# Patient Record
Sex: Male | Born: 1980 | Race: Black or African American | Hispanic: No | Marital: Single | Smoking: Never smoker
Health system: Southern US, Community
[De-identification: ages and names within clinical notes are randomized; demographics above are authoritative.]

## PROBLEM LIST (undated history)

## (undated) DIAGNOSIS — S86019A Strain of unspecified Achilles tendon, initial encounter: Secondary | ICD-10-CM

## (undated) HISTORY — PX: NO PAST SURGERIES: SHX2092

---

## 2015-03-15 ENCOUNTER — Emergency Department (HOSPITAL_COMMUNITY): Payer: Self-pay

## 2015-03-15 ENCOUNTER — Encounter (HOSPITAL_COMMUNITY): Payer: Self-pay | Admitting: Emergency Medicine

## 2015-03-15 ENCOUNTER — Emergency Department (HOSPITAL_COMMUNITY)
Admission: EM | Admit: 2015-03-15 | Discharge: 2015-03-15 | Disposition: A | Discharge status: Home / Self Care | Payer: Self-pay | Attending: Emergency Medicine | Admitting: Emergency Medicine

## 2015-03-15 DIAGNOSIS — Y9367 Activity, basketball: Secondary | ICD-10-CM | POA: Insufficient documentation

## 2015-03-15 DIAGNOSIS — S86002A Unspecified injury of left Achilles tendon, initial encounter: Secondary | ICD-10-CM | POA: Insufficient documentation

## 2015-03-15 DIAGNOSIS — Y92310 Basketball court as the place of occurrence of the external cause: Secondary | ICD-10-CM | POA: Insufficient documentation

## 2015-03-15 DIAGNOSIS — X58XXXA Exposure to other specified factors, initial encounter: Secondary | ICD-10-CM | POA: Insufficient documentation

## 2015-03-15 DIAGNOSIS — Y998 Other external cause status: Secondary | ICD-10-CM | POA: Insufficient documentation

## 2015-03-15 MED ORDER — HYDROCODONE-ACETAMINOPHEN 5-325 MG PO TABS: 1.0000 | ORAL_TABLET | ORAL | Status: DC | PRN

## 2015-03-15 MED ORDER — NAPROXEN 500 MG PO TABS
500.0000 mg | ORAL_TABLET | Freq: Once | ORAL | Status: AC
  Administered 2015-03-15: 500 mg via ORAL
  Filled 2015-03-15: qty 1

## 2015-03-15 MED ORDER — HYDROCODONE-ACETAMINOPHEN 5-325 MG PO TABS
1.0000 | ORAL_TABLET | Freq: Once | ORAL | Status: AC
  Administered 2015-03-15: 1 via ORAL
  Filled 2015-03-15: qty 1

## 2015-03-15 MED ORDER — NAPROXEN 500 MG PO TABS: 500.0000 mg | ORAL_TABLET | Freq: Two times a day (BID) | ORAL | Status: DC

## 2015-03-15 NOTE — Discharge Instructions (Signed)
Please follow the directions provided. Be sure to follow up with orthopedic doctor for further management of this injury. Be sure to wear your Cam Walker for comfort and support. You may take Naproxen twice a day for pain.  You may take vicodin for pain not relieved by naproxen.  Don't hesitate to return for any new, worsening or concerning symptoms.     SEEK IMMEDIATE MEDICAL CARE IF:  Pain increases, despite treatment.  Cast discomfort develops.  New, unexplained symptoms develop (drugs used in treatment may produce side effects).

## 2015-03-15 NOTE — ED Provider Notes (Signed)
CSN: [id number]     Arrival date & time 03/15/15  2036 History  This chart was scribed for non-physician provider Harle BattiestElizabeth Bailyn Spackman, NP-C, working with Rolan BuccoMelanie Belfi, MD by Phillis HaggisGabriella Gaje, ED Scribe. This patient was seen in room WTR9/WTR9 and patient care was started at 9:06 PM.    Chief Complaint  Patient presents with  . Ankle Injury   Patient is a 34 y.o. male presenting with lower extremity injury. The history is provided by the patient. No language interpreter was used.  Ankle Injury  HPI Comments: Zachary Memoslex Ramberg is a 34 y.o. male who presents to the Emergency Department complaining of left ankle injury onset PTA. He states that he was playing basketball when he tried to cross over and it felt like someone stepped on the back of his foot on the Achilles tendon. He states that he had to stop playing due to pain. He currently rates his pain as 5/10. He states that he is able to bear weight on the ankle but there is discomfort. He reports pain with flexion of foot. He denies allergies to medications.  History reviewed. No pertinent past medical history. History reviewed. No pertinent past surgical history. History reviewed. No pertinent family history. History  Substance Use Topics  . Smoking status: Not on file  . Smokeless tobacco: Not on file  . Alcohol Use: Not on file    Review of Systems  Musculoskeletal: Positive for arthralgias. Negative for joint swelling.  Skin: Negative for color change and wound.  Neurological: Negative for weakness and numbness.   Allergies  Review of patient's allergies indicates no known allergies.  Home Medications   Prior to Admission medications   Not on File   BP 159/97 mmHg  Pulse 121  Temp(Src) 98.7 F (37.1 C) (Oral)  Resp 18  SpO2 95%  Physical Exam  Constitutional: He is oriented to person, place, and time. He appears well-developed and well-nourished. No distress.  HENT:  Head: Normocephalic and atraumatic.  Eyes: Conjunctivae  and EOM are normal.  Neck: Normal range of motion. Neck supple.  Cardiovascular: Normal rate, regular rhythm and normal heart sounds.   Pulmonary/Chest: Effort normal and breath sounds normal.  Musculoskeletal: Normal range of motion. He exhibits no edema.  Decreased tension noted to the left achilles  Neurological: He is alert and oriented to person, place, and time.  Neurovascularly intact, 3/5 strength with plantarflexion of foot,but 5/5 of dorsiflexion.   Skin: Skin is warm and dry.  Psychiatric: He has a normal mood and affect. His behavior is normal.  Nursing note and vitals reviewed.   ED Course  Procedures (including critical care time) DIAGNOSTIC STUDIES: Oxygen Saturation is 95% on room air, normal by my interpretation.    COORDINATION OF CARE: 9:09 PM-Discussed treatment plan which includes x-ray, pain medication, and follow up with orthopedics if necessary with pt at bedside and pt agreed to plan.   Labs Review Labs Reviewed - No data to display  Imaging Review Dg Ankle Complete Left  03/15/2015   CLINICAL DATA:  Recent twisting injury while playing basketball, initial encounter  EXAM: LEFT ANKLE COMPLETE - 3+ VIEW  COMPARISON:  None.  FINDINGS: No acute fracture or dislocation is noted. No gross soft tissue abnormality is seen.  IMPRESSION: No acute abnormality noted.   Electronically Signed   By: Alcide CleverMark  Lukens M.D.   On: 03/15/2015 22:36     EKG Interpretation None      MDM   Final diagnoses:  Achilles  tendon injury, left, initial encounter   34 yo with ankle pain and exam concerning for achilles tendon injury.  X-ray is negative for fracture. Consulted Dr. Magnus IvanBlackman (orthopedics), recommended Cam walker and follow-up in office. Discussed plan with pt. Pt is well-appearing, in no acute distress and vital signs reviewed and not concerning. He appears safe to be discharged.  Discharge include follow-up with orthopedics.  Return precautions provided. Pt aware of plan  and in agreement.    I personally performed the services described in this documentation, which was scribed in my presence. The recorded information has been reviewed and is accurate.  Filed Vitals:   03/15/15 2047 03/15/15 2128 03/15/15 2309  BP: 159/97  146/86  Pulse: 121 109 96  Temp: 98.7 F (37.1 C)    TempSrc: Oral    Resp: 18    SpO2: 95%  97%   Meds given in ED:  Medications  HYDROcodone-acetaminophen (NORCO/VICODIN) 5-325 MG per tablet 1 tablet (1 tablet Oral Given 03/15/15 2126)  naproxen (NAPROSYN) tablet 500 mg (500 mg Oral Given 03/15/15 2126)    Discharge Medication List as of 03/15/2015 10:49 PM    START taking these medications   Details  HYDROcodone-acetaminophen (NORCO/VICODIN) 5-325 MG per tablet Take 1 tablet by mouth every 4 (four) hours as needed., Starting 03/15/2015, Until Discontinued, Print    naproxen (NAPROSYN) 500 MG tablet Take 1 tablet (500 mg total) by mouth 2 (two) times daily., Starting 03/15/2015, Until Discontinued, Print          Harle BattiestElizabeth Million Maharaj, NP 03/16/15 [id number]  Rolan BuccoMelanie Belfi, MD 03/17/15 [id number]

## 2015-03-15 NOTE — ED Notes (Signed)
Pt states he was playing basketball prior to ED arrival. While playing he states "it felt like someone stepped on the back of my ankle, and since then it's hurt to point my foot." No obvious swelling observed, palpable pulses bilaterally, sensation intact.

## 2015-03-17 ENCOUNTER — Other Ambulatory Visit (HOSPITAL_COMMUNITY): Payer: Self-pay | Admitting: Orthopaedic Surgery

## 2015-03-20 ENCOUNTER — Other Ambulatory Visit (HOSPITAL_COMMUNITY): Payer: Self-pay | Admitting: Orthopaedic Surgery

## 2015-03-20 ENCOUNTER — Other Ambulatory Visit (HOSPITAL_COMMUNITY): Payer: Self-pay | Admitting: *Deleted

## 2015-03-20 ENCOUNTER — Encounter (HOSPITAL_COMMUNITY): Payer: Self-pay | Admitting: *Deleted

## 2015-03-22 MED ORDER — DEXTROSE 5 % IV SOLN
3.0000 g | INTRAVENOUS | Status: AC
  Administered 2015-03-23: 3 g via INTRAVENOUS
  Filled 2015-03-22: qty 3000

## 2015-03-23 ENCOUNTER — Encounter (HOSPITAL_COMMUNITY): Payer: Self-pay | Admitting: *Deleted

## 2015-03-23 ENCOUNTER — Ambulatory Visit (HOSPITAL_COMMUNITY): Payer: Self-pay | Admitting: Anesthesiology

## 2015-03-23 ENCOUNTER — Ambulatory Visit (HOSPITAL_COMMUNITY)
Admission: RE | Admit: 2015-03-23 | Discharge: 2015-03-23 | Disposition: A | Discharge status: Home / Self Care | Payer: Self-pay | Source: Ambulatory Visit | Attending: Orthopaedic Surgery | Admitting: Orthopaedic Surgery

## 2015-03-23 ENCOUNTER — Encounter (HOSPITAL_COMMUNITY)
Admission: RE | Disposition: A | Discharge status: Home / Self Care | Payer: Self-pay | Source: Ambulatory Visit | Attending: Orthopaedic Surgery

## 2015-03-23 DIAGNOSIS — Y9367 Activity, basketball: Secondary | ICD-10-CM | POA: Insufficient documentation

## 2015-03-23 DIAGNOSIS — Z79899 Other long term (current) drug therapy: Secondary | ICD-10-CM | POA: Insufficient documentation

## 2015-03-23 DIAGNOSIS — Z79891 Long term (current) use of opiate analgesic: Secondary | ICD-10-CM | POA: Insufficient documentation

## 2015-03-23 DIAGNOSIS — X58XXXA Exposure to other specified factors, initial encounter: Secondary | ICD-10-CM | POA: Insufficient documentation

## 2015-03-23 DIAGNOSIS — Z791 Long term (current) use of non-steroidal anti-inflammatories (NSAID): Secondary | ICD-10-CM | POA: Insufficient documentation

## 2015-03-23 DIAGNOSIS — Z6837 Body mass index (BMI) 37.0-37.9, adult: Secondary | ICD-10-CM | POA: Insufficient documentation

## 2015-03-23 DIAGNOSIS — S86012A Strain of left Achilles tendon, initial encounter: Secondary | ICD-10-CM

## 2015-03-23 DIAGNOSIS — Y999 Unspecified external cause status: Secondary | ICD-10-CM | POA: Insufficient documentation

## 2015-03-23 DIAGNOSIS — Y929 Unspecified place or not applicable: Secondary | ICD-10-CM | POA: Insufficient documentation

## 2015-03-23 HISTORY — PX: ACHILLES TENDON SURGERY: SHX542

## 2015-03-23 HISTORY — DX: Strain of unspecified achilles tendon, initial encounter: S86.019A

## 2015-03-23 LAB — CBC
HEMATOCRIT: 44.5 % (ref 39.0–52.0)
HEMOGLOBIN: 14.5 g/dL (ref 13.0–17.0)
MCH: 28.7 pg (ref 26.0–34.0)
MCHC: 32.6 g/dL (ref 30.0–36.0)
MCV: 88.1 fL (ref 78.0–100.0)
Platelets: 221 10*3/uL (ref 150–400)
RBC: 5.05 MIL/uL (ref 4.22–5.81)
RDW: 13.2 % (ref 11.5–15.5)
WBC: 8 10*3/uL (ref 4.0–10.5)

## 2015-03-23 SURGERY — REPAIR, TENDON, ACHILLES
Anesthesia: General | Site: Leg Lower | Laterality: Left

## 2015-03-23 MED ORDER — ASPIRIN EC 325 MG PO TBEC: 325.0000 mg | DELAYED_RELEASE_TABLET | Freq: Two times a day (BID) | ORAL | Status: AC

## 2015-03-23 MED ORDER — HYDROMORPHONE HCL 1 MG/ML IJ SOLN: 0.2500 mg | INTRAMUSCULAR | Status: DC | PRN

## 2015-03-23 MED ORDER — 0.9 % SODIUM CHLORIDE (POUR BTL) OPTIME
TOPICAL | Status: DC | PRN
  Administered 2015-03-23: 1000 mL

## 2015-03-23 MED ORDER — PROMETHAZINE HCL 25 MG/ML IJ SOLN: 6.2500 mg | INTRAMUSCULAR | Status: DC | PRN

## 2015-03-23 MED ORDER — GLYCOPYRROLATE 0.2 MG/ML IJ SOLN
INTRAMUSCULAR | Status: DC | PRN
  Administered 2015-03-23: 0.6 mg via INTRAVENOUS

## 2015-03-23 MED ORDER — PROPOFOL 10 MG/ML IV BOLUS
INTRAVENOUS | Status: DC | PRN
  Administered 2015-03-23: 250 mg via INTRAVENOUS

## 2015-03-23 MED ORDER — ONDANSETRON HCL 4 MG/2ML IJ SOLN
INTRAMUSCULAR | Status: DC | PRN
  Administered 2015-03-23: 4 mg via INTRAVENOUS

## 2015-03-23 MED ORDER — ONDANSETRON HCL 4 MG/2ML IJ SOLN
INTRAMUSCULAR | Status: AC
  Filled 2015-03-23: qty 2

## 2015-03-23 MED ORDER — DEXAMETHASONE SODIUM PHOSPHATE 10 MG/ML IJ SOLN
INTRAMUSCULAR | Status: AC
  Filled 2015-03-23: qty 1

## 2015-03-23 MED ORDER — GLYCOPYRROLATE 0.2 MG/ML IJ SOLN
INTRAMUSCULAR | Status: AC
  Filled 2015-03-23: qty 3

## 2015-03-23 MED ORDER — ROCURONIUM BROMIDE 100 MG/10ML IV SOLN
INTRAVENOUS | Status: DC | PRN
  Administered 2015-03-23: 30 mg via INTRAVENOUS

## 2015-03-23 MED ORDER — DEXAMETHASONE SODIUM PHOSPHATE 10 MG/ML IJ SOLN
INTRAMUSCULAR | Status: DC | PRN
  Administered 2015-03-23: 10 mg via INTRAVENOUS

## 2015-03-23 MED ORDER — NEOSTIGMINE METHYLSULFATE 10 MG/10ML IV SOLN
INTRAVENOUS | Status: AC
Start: 2015-03-23 — End: 2015-03-23
  Filled 2015-03-23: qty 1

## 2015-03-23 MED ORDER — NEOSTIGMINE METHYLSULFATE 10 MG/10ML IV SOLN
INTRAVENOUS | Status: DC | PRN
  Administered 2015-03-23: 4 mg via INTRAVENOUS

## 2015-03-23 MED ORDER — MIDAZOLAM HCL 2 MG/2ML IJ SOLN
INTRAMUSCULAR | Status: AC
  Filled 2015-03-23: qty 2

## 2015-03-23 MED ORDER — FENTANYL CITRATE (PF) 100 MCG/2ML IJ SOLN
INTRAMUSCULAR | Status: DC | PRN
  Administered 2015-03-23: 100 ug via INTRAVENOUS
  Administered 2015-03-23: 50 ug via INTRAVENOUS
  Administered 2015-03-23: 100 ug via INTRAVENOUS

## 2015-03-23 MED ORDER — OXYCODONE-ACETAMINOPHEN 5-325 MG PO TABS: 1.0000 | ORAL_TABLET | ORAL | Status: AC | PRN

## 2015-03-23 MED ORDER — EPHEDRINE SULFATE 50 MG/ML IJ SOLN
INTRAMUSCULAR | Status: AC
  Filled 2015-03-23: qty 1

## 2015-03-23 MED ORDER — HYDROMORPHONE HCL 2 MG/ML IJ SOLN
INTRAMUSCULAR | Status: AC
  Filled 2015-03-23: qty 1

## 2015-03-23 MED ORDER — PROPOFOL 10 MG/ML IV BOLUS
INTRAVENOUS | Status: AC
  Filled 2015-03-23: qty 20

## 2015-03-23 MED ORDER — LIDOCAINE HCL (CARDIAC) 20 MG/ML IV SOLN
INTRAVENOUS | Status: AC
  Filled 2015-03-23: qty 5

## 2015-03-23 MED ORDER — MIDAZOLAM HCL 5 MG/5ML IJ SOLN
INTRAMUSCULAR | Status: DC | PRN
  Administered 2015-03-23: 2 mg via INTRAVENOUS

## 2015-03-23 MED ORDER — LIDOCAINE HCL (CARDIAC) 20 MG/ML IV SOLN
INTRAVENOUS | Status: DC | PRN
  Administered 2015-03-23: 100 mg via INTRAVENOUS

## 2015-03-23 MED ORDER — BUPIVACAINE HCL 0.25 % IJ SOLN
INTRAMUSCULAR | Status: DC | PRN
  Administered 2015-03-23: 20 mL

## 2015-03-23 MED ORDER — BUPIVACAINE HCL (PF) 0.25 % IJ SOLN
INTRAMUSCULAR | Status: AC
  Filled 2015-03-23: qty 30

## 2015-03-23 MED ORDER — FENTANYL CITRATE (PF) 250 MCG/5ML IJ SOLN
INTRAMUSCULAR | Status: AC
  Filled 2015-03-23: qty 5

## 2015-03-23 MED ORDER — LACTATED RINGERS IV SOLN
INTRAVENOUS | Status: DC
  Administered 2015-03-23: 10:00:00 via INTRAVENOUS

## 2015-03-23 MED ORDER — HYDROMORPHONE HCL 1 MG/ML IJ SOLN
INTRAMUSCULAR | Status: DC | PRN
  Administered 2015-03-23: 1 mg via INTRAVENOUS

## 2015-03-23 MED ORDER — ROCURONIUM BROMIDE 100 MG/10ML IV SOLN
INTRAVENOUS | Status: AC
  Filled 2015-03-23: qty 1

## 2015-03-23 MED ORDER — SODIUM CHLORIDE 0.9 % IJ SOLN
INTRAMUSCULAR | Status: AC
  Filled 2015-03-23: qty 10

## 2015-03-23 MED ORDER — OXYCODONE-ACETAMINOPHEN 5-325 MG PO TABS: 1.0000 | ORAL_TABLET | ORAL | Status: DC | PRN

## 2015-03-23 MED ORDER — SUCCINYLCHOLINE CHLORIDE 20 MG/ML IJ SOLN
INTRAMUSCULAR | Status: DC | PRN
  Administered 2015-03-23: 120 mg via INTRAVENOUS

## 2015-03-23 SURGICAL SUPPLY — 44 items
BANDAGE ELASTIC 4 VELCRO ST LF (GAUZE/BANDAGES/DRESSINGS) ×3 IMPLANT
BANDAGE ELASTIC 6 VELCRO ST LF (GAUZE/BANDAGES/DRESSINGS) ×3 IMPLANT
BANDAGE ESMARK 6X9 LF (GAUZE/BANDAGES/DRESSINGS) ×1 IMPLANT
BNDG ESMARK 6X9 LF (GAUZE/BANDAGES/DRESSINGS) ×3
CUFF TOURN SGL QUICK 34 (TOURNIQUET CUFF) ×2
CUFF TRNQT CYL 34X4X40X1 (TOURNIQUET CUFF) ×1 IMPLANT
DRAPE POUCH INSTRU U-SHP 10X18 (DRAPES) ×3 IMPLANT
DRAPE U-SHAPE 47X51 STRL (DRAPES) ×3 IMPLANT
DRSG PAD ABDOMINAL 8X10 ST (GAUZE/BANDAGES/DRESSINGS) ×3 IMPLANT
DURAPREP 26ML APPLICATOR (WOUND CARE) ×3 IMPLANT
ELECT REM PT RETURN 9FT ADLT (ELECTROSURGICAL) ×3
ELECTRODE REM PT RTRN 9FT ADLT (ELECTROSURGICAL) ×1 IMPLANT
GAUZE SPONGE 4X4 12PLY STRL (GAUZE/BANDAGES/DRESSINGS) ×3 IMPLANT
GAUZE XEROFORM 1X8 LF (GAUZE/BANDAGES/DRESSINGS) ×3 IMPLANT
GLOVE BIO SURGEON STRL SZ7.5 (GLOVE) ×6 IMPLANT
GLOVE BIOGEL PI IND STRL 7.5 (GLOVE) ×1 IMPLANT
GLOVE BIOGEL PI IND STRL 8 (GLOVE) ×2 IMPLANT
GLOVE BIOGEL PI INDICATOR 7.5 (GLOVE) ×2
GLOVE BIOGEL PI INDICATOR 8 (GLOVE) ×4
GLOVE ECLIPSE 8.0 STRL XLNG CF (GLOVE) ×3 IMPLANT
GOWN STRL REUS W/TWL XL LVL3 (GOWN DISPOSABLE) ×9 IMPLANT
KIT BASIN OR (CUSTOM PROCEDURE TRAY) ×3 IMPLANT
MANIFOLD NEPTUNE II (INSTRUMENTS) ×3 IMPLANT
NEEDLE HYPO 22GX1.5 SAFETY (NEEDLE) ×3 IMPLANT
NEEDLE MAYO .5 CIRCLE (NEEDLE) IMPLANT
PACK ORTHO EXTREMITY (CUSTOM PROCEDURE TRAY) ×3 IMPLANT
PAD CAST 4YDX4 CTTN HI CHSV (CAST SUPPLIES) ×1 IMPLANT
PADDING CAST COTTON 4X4 STRL (CAST SUPPLIES) ×2
PADDING CAST COTTON 6X4 STRL (CAST SUPPLIES) ×3 IMPLANT
POSITIONER SURGICAL ARM (MISCELLANEOUS) ×3 IMPLANT
SPLINT PLASTER CAST XFAST 5X30 (CAST SUPPLIES) ×1 IMPLANT
SPLINT PLASTER XFAST SET 5X30 (CAST SUPPLIES) ×2
SPONGE LAP 4X18 X RAY DECT (DISPOSABLE) ×6 IMPLANT
STOCKINETTE 6  STRL (DRAPES) ×2
STOCKINETTE 6 STRL (DRAPES) ×1 IMPLANT
SUT ETHILON 3 0 PS 1 (SUTURE) ×6 IMPLANT
SUT FIBERWIRE #2 38 T-5 BLUE (SUTURE) ×3
SUT VIC AB 0 CT1 36 (SUTURE) ×6 IMPLANT
SUT VIC AB 1 CT1 36 (SUTURE) ×3 IMPLANT
SUT VIC AB 2-0 CT1 27 (SUTURE) ×2
SUT VIC AB 2-0 CT1 TAPERPNT 27 (SUTURE) ×1 IMPLANT
SUTURE FIBERWR #2 38 T-5 BLUE (SUTURE) ×1 IMPLANT
SYR 20CC LL (SYRINGE) ×3 IMPLANT
TOWEL OR 17X26 10 PK STRL BLUE (TOWEL DISPOSABLE) ×3 IMPLANT

## 2015-03-23 NOTE — Progress Notes (Signed)
Ortho technician is here to fit patient to crutches. Patient is given crutch instruction by technician with teach back.

## 2015-03-23 NOTE — Anesthesia Postprocedure Evaluation (Signed)
Anesthesia Post Note  Patient: Zachary Silva  Procedure(s) Performed: Procedure(s) (LRB): LEFT ACHILLES TENDON REPAIR (Left)  Anesthesia type: general  Patient location: PACU  Post pain: Pain level controlled  Post assessment: Patient's Cardiovascular Status Stable  Last Vitals:  Filed Vitals:   03/23/15 1242  BP: 130/62  Pulse: 93  Temp: 36.3 C  Resp: 12    Post vital signs: Reviewed and stable  Level of consciousness: sedated  Complications: No apparent anesthesia complications

## 2015-03-23 NOTE — Transfer of Care (Signed)
Immediate Anesthesia Transfer of Care Note  Patient: Zachary Silva  Procedure(s) Performed: Procedure(s): LEFT ACHILLES TENDON REPAIR (Left)  Patient Location: PACU  Anesthesia Type:General  Level of Consciousness: awake, alert  and oriented  Airway & Oxygen Therapy: Patient Spontanous Breathing and Patient connected to face mask oxygen  Post-op Assessment: Report given to RN and Post -op Vital signs reviewed and stable  Post vital signs: Reviewed and stable  Last Vitals:  Filed Vitals:   03/23/15 0931  BP: 141/82  Pulse: 84  Temp: 36.9 C  Resp: 18    Complications: No apparent anesthesia complications

## 2015-03-23 NOTE — H&P (Signed)
Zachary Silva is an 34 y.o. male.   Chief Complaint:   Left ankle pain and weakness; known achilles tendon rupture HPI:   34 yo male with an acute left achilles tendon rupture following an injury playing basketball.  A repair has been recommended and this risks and benefits have been explained in great detail including the risks of infection' re-rupture, and DVT.  Past Medical History  Diagnosis Date  . Achilles tendon rupture     left    Past Surgical History  Procedure Laterality Date  . No past surgeries      History reviewed. No pertinent family history. Social History:  reports that he has never smoked. He has never used smokeless tobacco. He reports that he does not drink alcohol or use illicit drugs.  Allergies: No Known Allergies  Medications Prior to Admission  Medication Sig Dispense Refill  . HYDROcodone-acetaminophen (NORCO/VICODIN) 5-325 MG per tablet Take 1 tablet by mouth every 4 (four) hours as needed. (Patient taking differently: Take 1 tablet by mouth every 4 (four) hours as needed for moderate pain. ) 10 tablet 0  . Multiple Vitamins-Minerals (AIRBORNE) CHEW Chew 1-2 tablets by mouth daily.    . naproxen (NAPROSYN) 500 MG tablet Take 1 tablet (500 mg total) by mouth 2 (two) times daily. 30 tablet 0    Results for orders placed or performed during the hospital encounter of 03/23/15 (from the past 48 hour(s))  CBC     Status: None   Collection Time: 03/23/15  9:48 AM  Result Value Ref Range   WBC 8.0 4.0 - 10.5 K/uL   RBC 5.05 4.22 - 5.81 MIL/uL   Hemoglobin 14.5 13.0 - 17.0 g/dL   HCT 16.144.5 09.639.0 - 04.552.0 %   MCV 88.1 78.0 - 100.0 fL   MCH 28.7 26.0 - 34.0 pg   MCHC 32.6 30.0 - 36.0 g/dL   RDW 40.913.2 81.111.5 - 91.415.5 %   Platelets 221 150 - 400 K/uL   No results found.  Review of Systems  All other systems reviewed and are negative.   Blood pressure 141/82, pulse 84, temperature 98.4 F (36.9 C), temperature source Oral, resp. rate 18, height 5' 11.5" (1.816 m),  weight 124.286 kg (274 lb), SpO2 100 %. Physical Exam  Constitutional: He is oriented to person, place, and time. He appears well-developed and well-nourished.  HENT:  Head: Normocephalic and atraumatic.  Eyes: EOM are normal. Pupils are equal, round, and reactive to light.  Neck: Normal range of motion. Neck supple.  Cardiovascular: Normal rate and regular rhythm.   Respiratory: Effort normal and breath sounds normal.  GI: Soft. Bowel sounds are normal.  Musculoskeletal:       Left ankle: He exhibits decreased range of motion, swelling and ecchymosis. Achilles tendon exhibits abnormal Thompson's test results.  Neurological: He is alert and oriented to person, place, and time.  Skin: Skin is warm and dry.  Psychiatric: He has a normal mood and affect.     Assessment/Plan Acute left achilles tendon complete rupture 1)  To the OR today as an outpatient for a direct primary repair of his ruptured achilles tendon  Zachary Silva Y 03/23/2015, 9:59 AM

## 2015-03-23 NOTE — Discharge Instructions (Signed)
No weight at all on your left ankle. Elevation for foot swelling. Keep your splint clean and dry. Do take a full strength 325 mg aspirin twice daily with meals. Achilles Tendon Repair, Care After Refer to this sheet in the next few weeks. These instructions provide you with information on caring for yourself after your procedure. Your health care provider may also give you more specific instructions. Your treatment has been planned according to current medical practices, but problems sometimes occur. Call your health care provider if you have any problems or questions after your procedure. WHAT TO EXPECT AFTER THE PROCEDURE  Your lower leg may feel numb for several hours.  You may feel pain when the numbing medicine wears off.  Most people can start to walk normally in about 6 weeks. It may be 6 months before you can do all your regular activities. Complete recovery can take a year or longer. HOME CARE INSTRUCTIONS  Take pain medicines as directed by your health care provider.  Do not take over-the-counter medicines unless your health care provider says it is okay.  Do not walk on or put weight on your injured leg.  Keep your cast or splint dry while bathing.  Use crutches or another walking aid.  When you are resting, keep your leg above the level of your heart.  Go back to your health care provider about 2 weeks after surgery to have your splint or cast removed. If you have stitches, your health care provider will also take them out during this time.  After your cast or splint is removed, your health care provider will give you instructions on how to move your ankle and how much weight you can put on your leg. Follow your health care provider's instructions.  See a physical therapist if directed by your health care provider. A physical therapist can help you regain ankle motion and strengthen your leg muscles.  Keep all follow-up appointments. SEEK MEDICAL CARE IF:   You have  chills.  You have a fever.  Your pain medicine is not working.  You have persistent numbness or burning. SEEK IMMEDIATE MEDICAL CARE IF:   You have pain or numbness that is getting worse.  You are unable to move your toes.  You are bleeding through your cast or splint.  You notice redness, swelling, bleeding, or discharge near your cut (incision) after your cast or splint has been removed.  You notice warmth, swelling, or tenderness in the back of your lower leg.  You have chest pain.  You have trouble breathing. MAKE SURE YOU:  Understand these instructions.  Will watch your condition.  Will get help right away if you are not doing well or get worse. Document Released: 07/03/2004 Document Revised: 11/02/2013 Document Reviewed: 09/13/2013 [facility] Patient Information 2015 [place], [place]. This information is not intended to replace advice given to you by your health care provider. Make sure you discuss any questions you have with your health care provider.  General Anesthesia, Care After Refer to this sheet in the next few weeks. These instructions provide you with information on caring for yourself after your procedure. Your health care provider may also give you more specific instructions. Your treatment has been planned according to current medical practices, but problems sometimes occur. Call your health care provider if you have any problems or questions after your procedure. WHAT TO EXPECT AFTER THE PROCEDURE After the procedure, it is typical to experience:  Sleepiness.  Nausea and vomiting. HOME CARE INSTRUCTIONS  For the first 24 hours after general anesthesia:  Have a responsible person with you.  Do not drive a car. If you are alone, do not take public transportation.  Do not drink alcohol.  Do not take medicine that has not been prescribed by your health care provider.  Do not sign important papers or make important decisions.  You may resume a normal  diet and activities as directed by your health care provider.  Change bandages (dressings) as directed.  If you have questions or problems that seem related to general anesthesia, call the hospital and ask for the anesthetist or anesthesiologist on call. SEEK MEDICAL CARE IF:  You have nausea and vomiting that continue the day after anesthesia.  You develop a rash. SEEK IMMEDIATE MEDICAL CARE IF:   You have difficulty breathing.  You have chest pain.  You have any allergic problems. Document Released: 02/03/2001 Document Revised: 11/02/2013 Document Reviewed: 05/13/2013 [facility] Patient Information 2015 [place], [place]. This information is not intended to replace advice given to you by your health care provider. Make sure you discuss any questions you have with your health care provider.

## 2015-03-23 NOTE — Anesthesia Preprocedure Evaluation (Addendum)
Anesthesia Evaluation  Patient identified by MRN, date of birth, ID band Patient awake    Reviewed: Allergy & Precautions, NPO status , Patient's Chart, lab work & pertinent test results  History of Anesthesia Complications Negative for: history of anesthetic complications  Airway Mallampati: II  TM Distance: >3 FB Neck ROM: Full    Dental  (+) Teeth Intact, Dental Advisory Given   Pulmonary neg pulmonary ROS,    Pulmonary exam normal       Cardiovascular negative cardio ROS Normal cardiovascular exam    Neuro/Psych negative neurological ROS  negative psych ROS   GI/Hepatic negative GI ROS, Neg liver ROS,   Endo/Other  Morbid obesity  Renal/GU negative Renal ROS     Musculoskeletal   Abdominal   Peds  Hematology   Anesthesia Other Findings   Reproductive/Obstetrics                           Anesthesia Physical Anesthesia Plan  ASA: II  Anesthesia Plan: General   Post-op Pain Management:    Induction: Intravenous  Airway Management Planned: LMA and Oral ETT  Additional Equipment:   Intra-op Plan:   Post-operative Plan: Extubation in OR  Informed Consent: I have reviewed the patients History and Physical, chart, labs and discussed the procedure including the risks, benefits and alternatives for the proposed anesthesia with the patient or authorized representative who has indicated his/her understanding and acceptance.   Dental advisory given  Plan Discussed with: CRNA, Anesthesiologist and Surgeon  Anesthesia Plan Comments:        Anesthesia Quick Evaluation

## 2015-03-23 NOTE — Addendum Note (Signed)
Addendum  created 03/23/15 1259 by Thornell MuleHoward G Colen Eltzroth, CRNA   Modules edited: Charges VN

## 2015-03-23 NOTE — Anesthesia Procedure Notes (Signed)
Procedure Name: Intubation Date/Time: 03/23/2015 10:28 AM Performed by: Thornell MuleSTUBBLEFIELD, Tyner Codner G Pre-anesthesia Checklist: Patient identified, Emergency Drugs available, Suction available and Patient being monitored Patient Re-evaluated:Patient Re-evaluated prior to inductionOxygen Delivery Method: Circle System Utilized Preoxygenation: Pre-oxygenation with 100% oxygen Intubation Type: IV induction Ventilation: Mask ventilation without difficulty Laryngoscope Size: Miller and 3 Grade View: Grade I Tube type: Oral Tube size: 7.5 mm Number of attempts: 1 Airway Equipment and Method: Stylet and Oral airway Placement Confirmation: ETT inserted through vocal cords under direct vision,  positive ETCO2 and breath sounds checked- equal and bilateral Secured at: 22 cm Tube secured with: Tape Dental Injury: Teeth and Oropharynx as per pre-operative assessment

## 2015-03-23 NOTE — Brief Op Note (Signed)
03/23/2015  11:25 AM  PATIENT:  Zachary Silva  34 y.o. male  PRE-OPERATIVE DIAGNOSIS:  Left achilles tendon rupture  POST-OPERATIVE DIAGNOSIS:  Left achilles tendon rupture  PROCEDURE:  Procedure(s): LEFT ACHILLES TENDON REPAIR (Left)  SURGEON:  Surgeon(s) and Role:    * Kathryne Hitchhristopher Y Blackman, MD - Primary  PHYSICIAN ASSISTANT: Rexene EdisonGil clark, PA-C  ANESTHESIA:   local and general  EBL:   minimal  BLOOD ADMINISTERED:none  DRAINS: none   LOCAL MEDICATIONS USED:  Marcaine  SPECIMEN:  No Specimen  DISPOSITION OF SPECIMEN:  N/A  COUNTS:  YES  TOURNIQUET: less than one hour  DICTATION: .Other Dictation: Dictation Number [id number]  PLAN OF CARE: Discharge to home after PACU  PATIENT DISPOSITION:  PACU - hemodynamically stable.   Delay start of Pharmacological VTE agent (>24hrs) due to surgical blood loss or risk of bleeding: not applicable

## 2015-03-24 ENCOUNTER — Encounter (HOSPITAL_COMMUNITY): Payer: Self-pay | Admitting: Orthopaedic Surgery

## 2015-03-24 NOTE — Op Note (Signed)
NAMMaren Silva:  Purtee, Jazz                 ACCOUNT NO.:  [id number]  MEDICAL RECORD NO.:  [id number]  LOCATION:  [facility]                         [facility]  PHYSICIAN:  Vanita PandaChristopher Y. Magnus IvanBlackman, M.D.DATE OF BIRTH:  Sep 06, 1981  DATE OF PROCEDURE:  03/23/2015 DATE OF DISCHARGE:  03/23/2015                              OPERATIVE REPORT   PREOPERATIVE DIAGNOSIS:  Acute complete rupture of left Achilles tendon.  POSTOPERATIVE DIAGNOSIS:  Acute complete rupture of left Achilles tendon.  PROCEDURE:  Direct primary repair of left Achilles tendon.  SURGEON:  Vanita PandaChristopher Y. Magnus IvanBlackman, MD  ASSISTANT:  Richardean CanalGilbert Clark, PA-C  ANESTHESIA: 1. General. 2. Local with 0.25% plain Marcaine.  ANTIBIOTICS:  3 g IV Ancef.  BLOOD LOSS:  Minimal.  TOURNIQUET TIME:  Less than 1 hour.  COMPLICATIONS:  None.  INDICATIONS:  Trinna Postlex is a 34 year old gentleman who a week ago was playing basketball, sustaining an acute injury to his left Achilles tendon.  We saw him in the office and diagnosed a complete rupture.  He has been in a Cam walking boot, now he presents for direct primary repair, fully understanding the risks of infection, DVT, and re-rupture rate.  He understands that he will be nonweightbearing for a period of 4 weeks and would take significant rehab once the repair has been performed and he understands this as well.  PROCEDURE DESCRIPTION:  After informed consent was obtained, appropriate left ankle was marked.  He was brought to the operating room.  General anesthesia was obtained while he was on a stretcher in the supine position.  A nonsterile tourniquet was placed around his upper left leg. He was then rolled in a prone position with appropriate rolls around his chest.  Appropriate positioning of his arms, head and neck and genitals and padding of his legs.  His left operative ankle was then prepped and draped with DuraPrep and sterile drapes.  A time-out was called to identify correct  patient, correct left ankle.  We then used an Esmarch wrap up the ankle and the tourniquet was inflated to 300 mm of pressure. We then made incision directly over the Achilles and carried this proximally and distally finding a complete rupture of the Achilles tendon with about the paratenon and cleaned up the rough edges of the Achilles.  We then used #2 FiberWire suture in a Krackow-type format with 2 limbs in distal piece of the Achilles and 2 in the proximal piece.  We then tied these down separately to bring the Achilles back together.  We oversewed this with a #1 Ethibond suture.  We then were able to bring the paratenon, back of the Achilles with 0 Vicryl followed by two 0 Vicryl in the deep tissue, 2-0 Vicryl in the subcutaneous tissue, and interrupted 2-0 nylon on the skin.  We infiltrated the incision with 0.25% plain Marcaine and placed a Xeroform and a well- padded sterile dressing, including a splint with the foot in a plantar flexed position.  He was then rolled back to a supine position, awakened and extubated and taken to recovery room in stable condition.  All final counts were correct.  There were no complications noted. Postoperatively, will be  discharged from the PACU with crutches and nonweightbearing on his left ankle with followup in the office in 2 weeks.     Vanita Pandahristopher Y. Magnus IvanBlackman, M.D.     CYB/MEDQ  D:  03/23/2015  T:  03/24/2015  Job:  [id number]

## 2016-02-03 IMAGING — CR DG ANKLE COMPLETE 3+V*L*
3 series · 3 of 3 positions shown · non-contrast
Comparison: None.

CLINICAL DATA: Recent twisting injury while playing basketball,
initial encounter

EXAM:
LEFT ANKLE COMPLETE - 3+ VIEW

[x ankle ap left]
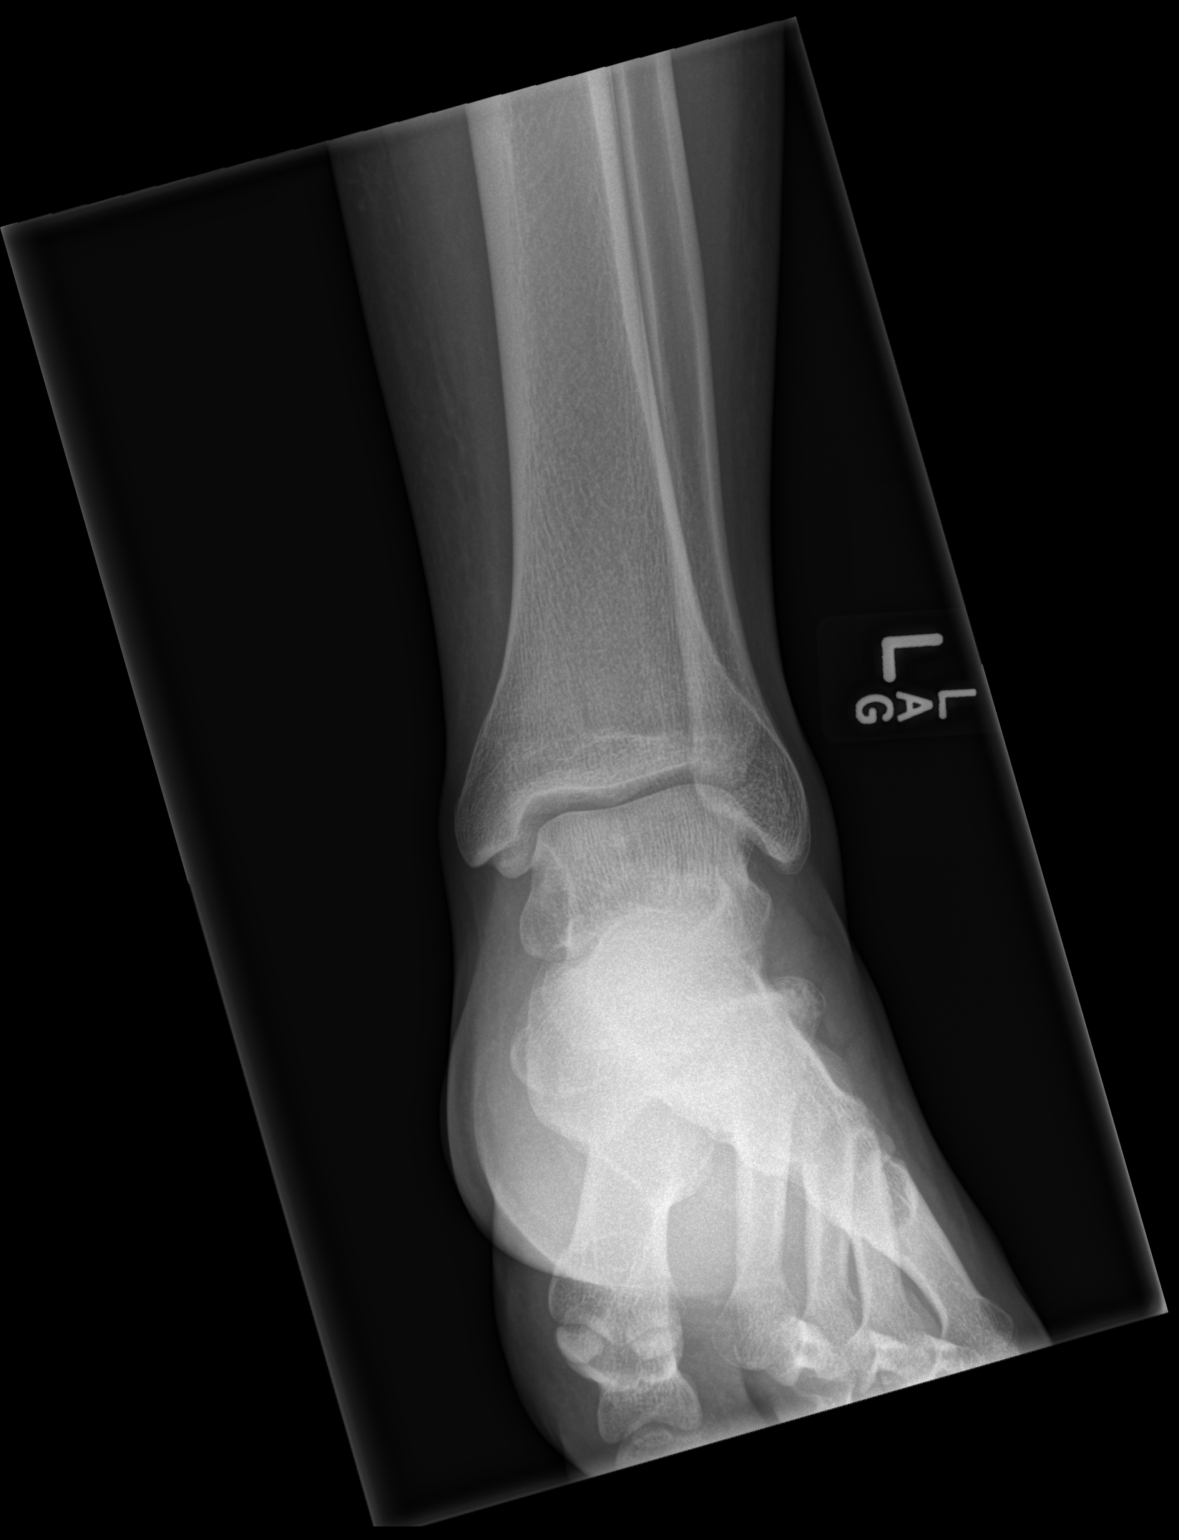

[x ankle obl left]
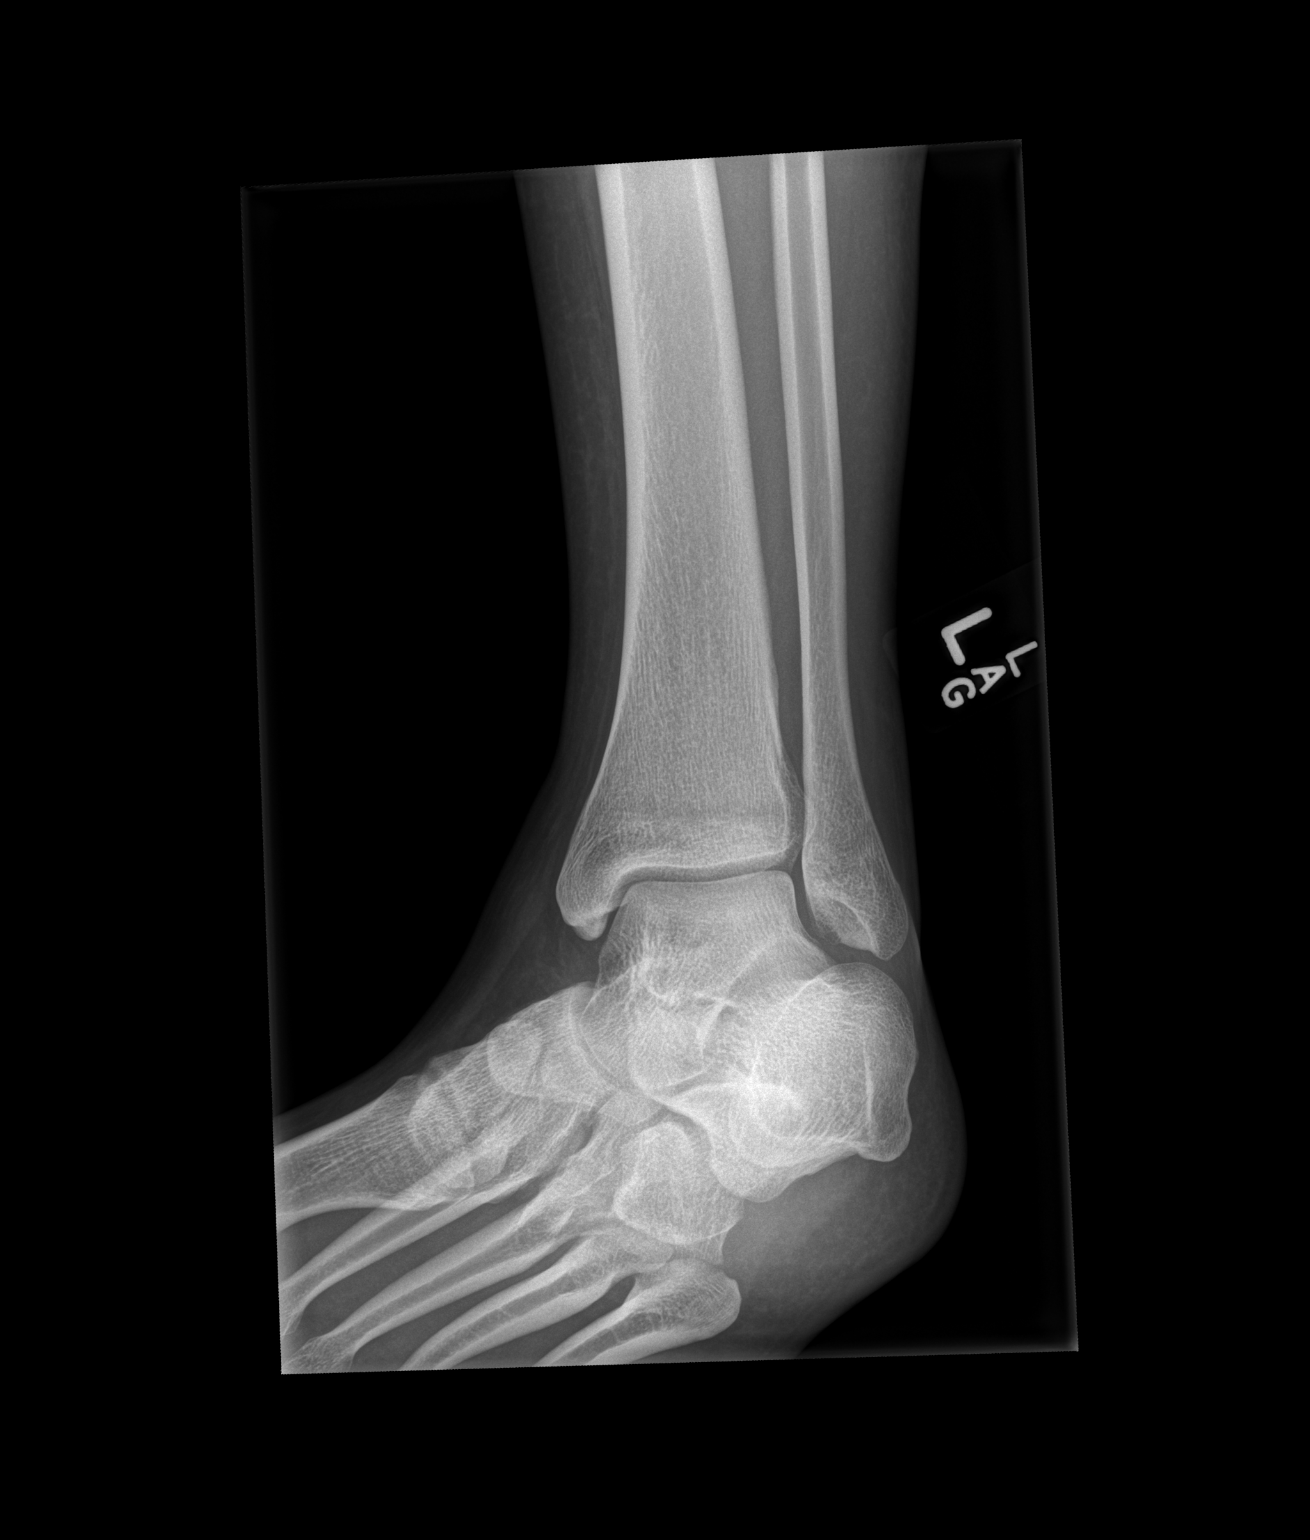

[x ankle lat left]
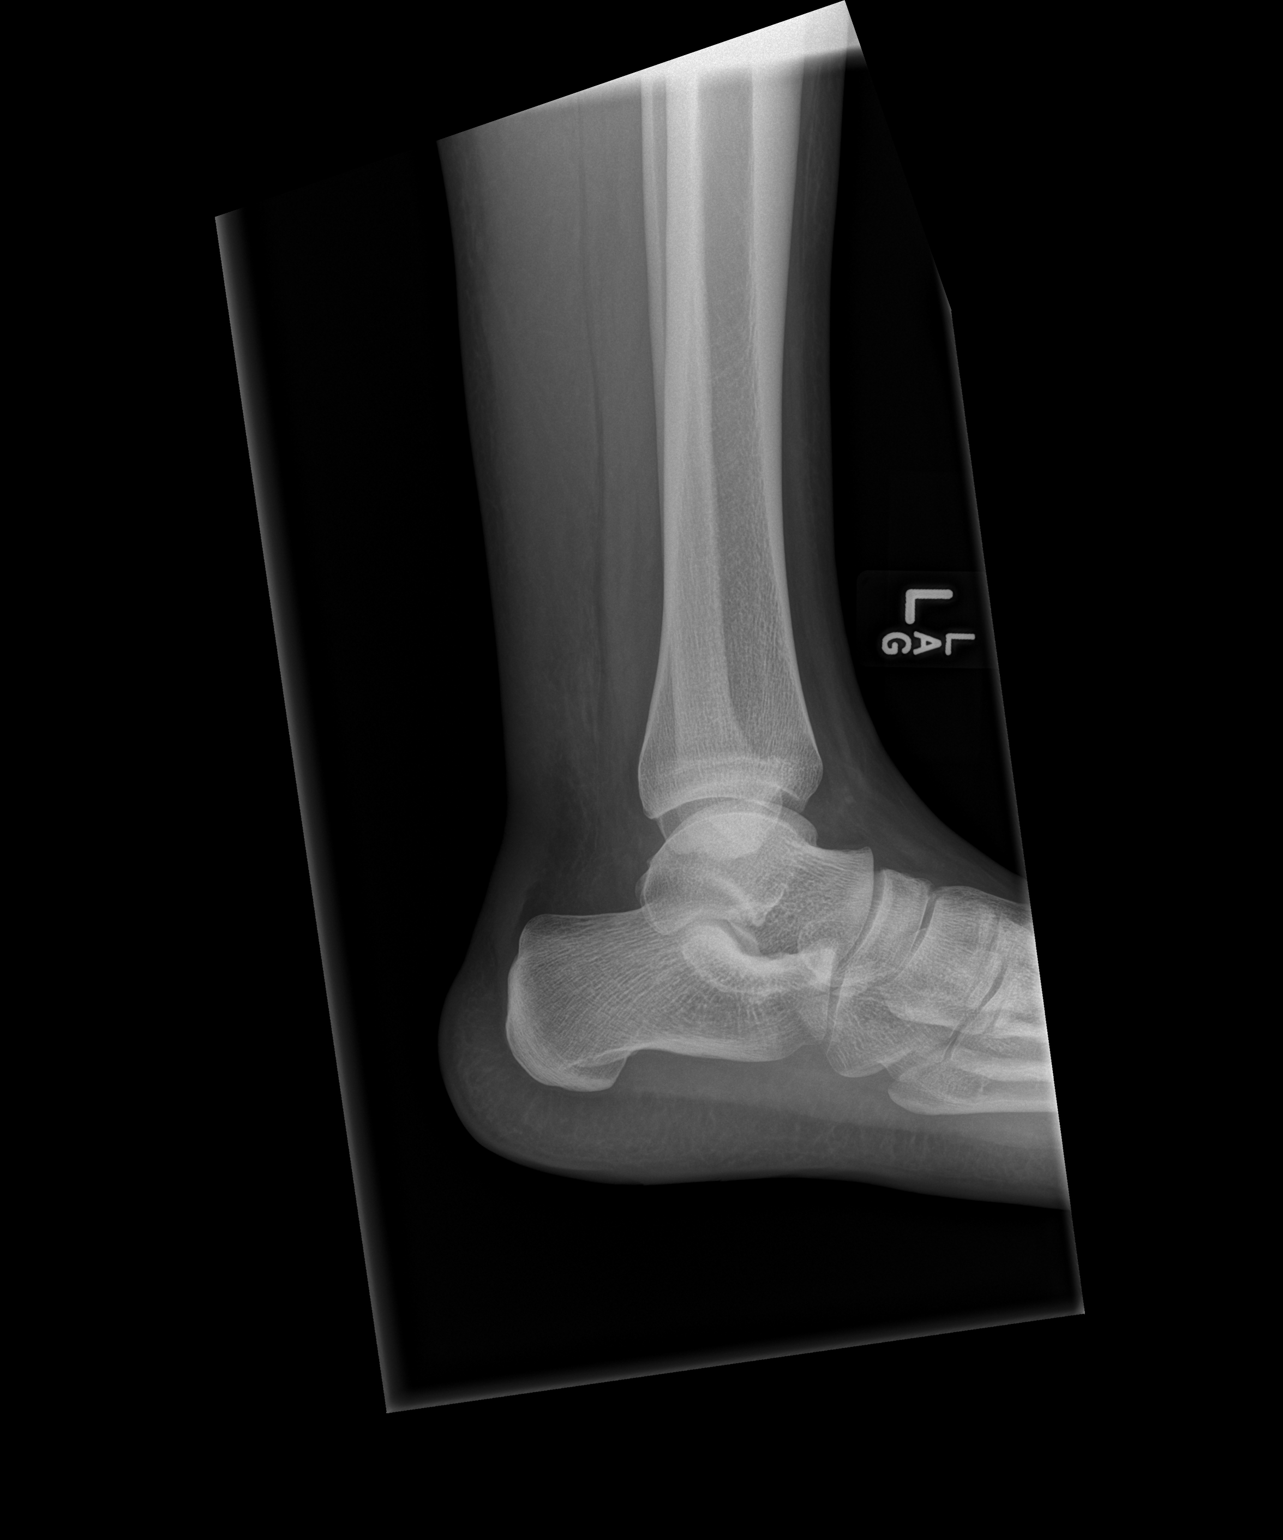

[3 of 3 positions shown; findings below may reference images not displayed]

FINDINGS: No acute fracture or dislocation is noted. No gross soft tissue
abnormality is seen.
IMPRESSION: No acute abnormality noted.

## 2020-01-13 ENCOUNTER — Ambulatory Visit: Payer: Self-pay | Attending: Family

## 2020-01-13 DIAGNOSIS — Z23 Encounter for immunization: Secondary | ICD-10-CM | POA: Insufficient documentation

## 2020-01-13 NOTE — Progress Notes (Signed)
   Covid-19 Vaccination Clinic  Name:  Zachary Silva    MRN: [id number] DOB: 1981/07/27  01/13/2020  [name] was observed post Covid-19 immunization for 15 minutes without incident. He was provided with Vaccine Information Sheet and instruction to access the V-Safe system.   [name] was instructed to call 911 with any severe reactions post vaccine: Marland Kitchen Difficulty breathing  . Swelling of face and throat  . A fast heartbeat  . A bad rash all over body  . Dizziness and weakness   Immunizations Administered    Name Date Dose VIS Date Route   Moderna COVID-19 Vaccine 01/13/2020  2:16 PM 0.5 mL 10/12/2019 Intramuscular   Manufacturer: Moderna   Lot: [id number]   NDC: 16010-932-35

## 2020-02-15 ENCOUNTER — Ambulatory Visit: Payer: Self-pay | Attending: Family

## 2020-02-15 DIAGNOSIS — Z23 Encounter for immunization: Secondary | ICD-10-CM

## 2020-02-15 NOTE — Progress Notes (Signed)
   [facility]ination [facility]  Name:  Zachary Silva    MRN: [id number] DOB: [birth year]  02/15/2020  Mr. Zachary Silva was observed post Covid-19 immunization for 15 minutes without incident. He was provided with Vaccine Information Sheet and instruction to access the V-Safe system.   Mr. Zachary Silva was instructed to call 911 with any severe reactions post vaccine: Marland Kitchen Difficulty breathing  . Swelling of face and throat  . A fast heartbeat  . A bad rash all over body  . Dizziness and weakness   Immunizations Administered    Name Date Dose VIS Date Route   Moderna COVID-19 Vaccine 02/15/2020  2:15 PM 0.5 mL 10/12/2019 Intramuscular   Manufacturer: Moderna   Lot: [id number]   NDC: 94854-627-03
